# Patient Record
Sex: Male | Born: 1955 | Race: White | Hispanic: No | Marital: Married | Smoking: Never smoker
Health system: Southern US, Community
[De-identification: ages and names within clinical notes are randomized; demographics above are authoritative.]

## PROBLEM LIST (undated history)

## (undated) DIAGNOSIS — E0590 Thyrotoxicosis, unspecified without thyrotoxic crisis or storm: Secondary | ICD-10-CM

## (undated) DIAGNOSIS — I4891 Unspecified atrial fibrillation: Secondary | ICD-10-CM

## (undated) HISTORY — PX: PACEMAKER INSERTION: SHX728

---

## 2013-09-19 ENCOUNTER — Encounter (HOSPITAL_COMMUNITY): Payer: Self-pay | Admitting: Emergency Medicine

## 2013-09-19 ENCOUNTER — Observation Stay (HOSPITAL_COMMUNITY)
Admission: EM | Admit: 2013-09-19 | Discharge: 2013-09-20 | Disposition: A | Discharge status: Home / Self Care | Payer: BC Managed Care – PPO | Attending: Internal Medicine | Admitting: Internal Medicine

## 2013-09-19 ENCOUNTER — Emergency Department (HOSPITAL_COMMUNITY): Payer: BC Managed Care – PPO

## 2013-09-19 DIAGNOSIS — R509 Fever, unspecified: Secondary | ICD-10-CM | POA: Diagnosis present

## 2013-09-19 DIAGNOSIS — I10 Essential (primary) hypertension: Secondary | ICD-10-CM | POA: Insufficient documentation

## 2013-09-19 DIAGNOSIS — E876 Hypokalemia: Secondary | ICD-10-CM | POA: Diagnosis present

## 2013-09-19 DIAGNOSIS — E0591 Thyrotoxicosis, unspecified with thyrotoxic crisis or storm: Principal | ICD-10-CM | POA: Diagnosis present

## 2013-09-19 DIAGNOSIS — Z7901 Long term (current) use of anticoagulants: Secondary | ICD-10-CM | POA: Insufficient documentation

## 2013-09-19 DIAGNOSIS — I4891 Unspecified atrial fibrillation: Secondary | ICD-10-CM | POA: Diagnosis present

## 2013-09-19 DIAGNOSIS — IMO0002 Reserved for concepts with insufficient information to code with codable children: Secondary | ICD-10-CM | POA: Insufficient documentation

## 2013-09-19 DIAGNOSIS — Z950 Presence of cardiac pacemaker: Secondary | ICD-10-CM | POA: Insufficient documentation

## 2013-09-19 HISTORY — DX: Unspecified atrial fibrillation: I48.91

## 2013-09-19 HISTORY — DX: Thyrotoxicosis, unspecified without thyrotoxic crisis or storm: E05.90

## 2013-09-19 LAB — CBC
HEMATOCRIT: 41.3 % (ref 39.0–52.0)
Hemoglobin: 14.1 g/dL (ref 13.0–17.0)
MCH: 30.7 pg (ref 26.0–34.0)
MCHC: 34.1 g/dL (ref 30.0–36.0)
MCV: 89.8 fL (ref 78.0–100.0)
Platelets: 195 10*3/uL (ref 150–400)
RBC: 4.6 MIL/uL (ref 4.22–5.81)
RDW: 12.6 % (ref 11.5–15.5)
WBC: 5.5 10*3/uL (ref 4.0–10.5)

## 2013-09-19 LAB — BASIC METABOLIC PANEL
BUN: 18 mg/dL (ref 6–23)
CHLORIDE: 101 meq/L (ref 96–112)
CO2: 28 mEq/L (ref 19–32)
Calcium: 9.4 mg/dL (ref 8.4–10.5)
Creatinine, Ser: 1.08 mg/dL (ref 0.50–1.35)
GFR calc non Af Amer: 73 mL/min — ABNORMAL LOW (ref >90)
GFR, EST AFRICAN AMERICAN: 85 mL/min — AB (ref >90)
Glucose, Bld: 95 mg/dL (ref 70–99)
POTASSIUM: 3.9 meq/L (ref 3.7–5.3)
SODIUM: 140 meq/L (ref 137–147)

## 2013-09-19 LAB — HEPATIC FUNCTION PANEL
ALT: 15 U/L (ref 0–53)
AST: 15 U/L (ref 0–37)
Albumin: 3.4 g/dL — ABNORMAL LOW (ref 3.5–5.2)
Alkaline Phosphatase: 82 U/L (ref 39–117)
Bilirubin, Direct: <0.2 mg/dL (ref 0.0–0.3)
Total Bilirubin: 0.4 mg/dL (ref 0.3–1.2)
Total Protein: 6.3 g/dL (ref 6.0–8.3)

## 2013-09-19 LAB — I-STAT TROPONIN, ED: TROPONIN I, POC: 0 ng/mL (ref 0.00–0.08)

## 2013-09-19 LAB — URINE MICROSCOPIC-ADD ON

## 2013-09-19 LAB — URINALYSIS, ROUTINE W REFLEX MICROSCOPIC
BILIRUBIN URINE: NEGATIVE
Glucose, UA: NEGATIVE mg/dL
KETONES UR: NEGATIVE mg/dL
Leukocytes, UA: NEGATIVE
NITRITE: NEGATIVE
PROTEIN: NEGATIVE mg/dL
SPECIFIC GRAVITY, URINE: 1.015 (ref 1.005–1.030)
UROBILINOGEN UA: 1 mg/dL (ref 0.0–1.0)
pH: 6.5 (ref 5.0–8.0)

## 2013-09-19 LAB — T4, FREE: FREE T4: 3.81 ng/dL — AB (ref 0.80–1.80)

## 2013-09-19 LAB — TSH

## 2013-09-19 MED ORDER — HYDROMORPHONE HCL PF 1 MG/ML IJ SOLN: 0.5000 mg | INTRAMUSCULAR | Status: DC | PRN

## 2013-09-19 MED ORDER — LISINOPRIL 10 MG PO TABS
10.0000 mg | ORAL_TABLET | Freq: Every day | ORAL | Status: DC
  Administered 2013-09-20: 10 mg via ORAL
  Filled 2013-09-19: qty 1

## 2013-09-19 MED ORDER — OXYCODONE HCL 5 MG PO TABS: 5.0000 mg | ORAL_TABLET | ORAL | Status: DC | PRN

## 2013-09-19 MED ORDER — ONDANSETRON HCL 4 MG/2ML IJ SOLN: 4.0000 mg | Freq: Four times a day (QID) | INTRAMUSCULAR | Status: DC | PRN

## 2013-09-19 MED ORDER — METOPROLOL TARTRATE 1 MG/ML IV SOLN: 2.5000 mg | Freq: Four times a day (QID) | INTRAVENOUS | Status: DC | PRN

## 2013-09-19 MED ORDER — ONDANSETRON HCL 4 MG PO TABS
4.0000 mg | ORAL_TABLET | Freq: Four times a day (QID) | ORAL | Status: DC | PRN
Start: 2013-09-19 — End: 2013-09-20

## 2013-09-19 MED ORDER — ALUM & MAG HYDROXIDE-SIMETH 200-200-20 MG/5ML PO SUSP: 30.0000 mL | Freq: Four times a day (QID) | ORAL | Status: DC | PRN

## 2013-09-19 MED ORDER — ACETAMINOPHEN 650 MG RE SUPP
650.0000 mg | Freq: Four times a day (QID) | RECTAL | Status: DC | PRN
Start: 2013-09-19 — End: 2013-09-20

## 2013-09-19 MED ORDER — SODIUM CHLORIDE 0.9 % IV SOLN
INTRAVENOUS | Status: DC
  Administered 2013-09-20: via INTRAVENOUS

## 2013-09-19 MED ORDER — HYDROCHLOROTHIAZIDE 12.5 MG PO CAPS
12.5000 mg | ORAL_CAPSULE | Freq: Every day | ORAL | Status: DC
  Administered 2013-09-20: 12.5 mg via ORAL
  Filled 2013-09-19: qty 1

## 2013-09-19 MED ORDER — METHIMAZOLE 10 MG PO TABS
10.0000 mg | ORAL_TABLET | Freq: Three times a day (TID) | ORAL | Status: DC
  Administered 2013-09-20: 10 mg via ORAL
  Filled 2013-09-19 (×4): qty 1

## 2013-09-19 MED ORDER — LISINOPRIL-HYDROCHLOROTHIAZIDE 10-12.5 MG PO TABS: 1.0000 | ORAL_TABLET | Freq: Every day | ORAL | Status: DC

## 2013-09-19 MED ORDER — PREDNISONE 10 MG PO TABS
10.0000 mg | ORAL_TABLET | Freq: Two times a day (BID) | ORAL | Status: DC
  Administered 2013-09-20: 10 mg via ORAL
  Filled 2013-09-19 (×3): qty 1

## 2013-09-19 MED ORDER — ACETAMINOPHEN 325 MG PO TABS
650.0000 mg | ORAL_TABLET | Freq: Once | ORAL | Status: AC
  Administered 2013-09-19: 650 mg via ORAL
  Filled 2013-09-19: qty 2

## 2013-09-19 MED ORDER — METOPROLOL SUCCINATE ER 100 MG PO TB24
100.0000 mg | ORAL_TABLET | Freq: Two times a day (BID) | ORAL | Status: DC
  Administered 2013-09-20: 100 mg via ORAL
  Filled 2013-09-19 (×3): qty 1

## 2013-09-19 MED ORDER — ACETAMINOPHEN 325 MG PO TABS
650.0000 mg | ORAL_TABLET | Freq: Four times a day (QID) | ORAL | Status: DC | PRN
Start: 2013-09-19 — End: 2013-09-20

## 2013-09-19 MED ORDER — SODIUM CHLORIDE 0.9 % IV BOLUS (SEPSIS)
1000.0000 mL | INTRAVENOUS | Status: AC
  Administered 2013-09-19: 1000 mL via INTRAVENOUS

## 2013-09-19 MED ORDER — LORAZEPAM 2 MG/ML IJ SOLN: 1.0000 mg | Freq: Four times a day (QID) | INTRAMUSCULAR | Status: DC | PRN

## 2013-09-19 MED ORDER — DABIGATRAN ETEXILATE MESYLATE 150 MG PO CAPS
150.0000 mg | ORAL_CAPSULE | Freq: Two times a day (BID) | ORAL | Status: DC
  Administered 2013-09-20: 150 mg via ORAL
  Filled 2013-09-19 (×3): qty 1

## 2013-09-19 NOTE — ED Notes (Signed)
Pt transported to Xray. 

## 2013-09-19 NOTE — H&P (Signed)
[facility] History and Physical  Shawn Platehomas Varano WUJ:[id number] DOB: [birth year] DOA: 09/19/2013  Referring physician:  PCP: Pcp Not In System  Specialists:   Chief Complaint:   HPI: Shawn Berry is a 58 y.o. male with a recent diagnosis of Hyperthyroidism who presents to the ED due to complaints of feeling worse with palpitations, fever and sweats and a feeling of jitteriness over the past week.  He has been placed on Methimazole, and Metoprolol Rx.   He is visiting from out of town and his Endocrinologist is at [facility], and he was advised to report to the nearest hospital if he felt his symptoms were worse.   In the ED. A repeat TSH level was checked and found to be < 0.005.   He was also found ti be in atrial fibrillation with RVR.   He reports having Atrial fibrillation in the past and had been on Amiodarone Rx, and had a Cardioversion X 1.  He reports that his thyroid disease is due to the Amiodarone Rx.    He also reports that he has a pacemaker due to previously having pauses in his heart beat.      Review of Systems:  Constitutional: No Weight Loss, No Weight Gain, Night Sweats, +Fevers, Chills, Fatigue, or Generalized Weakness HEENT: No Headaches, Difficulty Swallowing,Tooth/Dental Problems,Sore Throat,  No Sneezing, Rhinitis, Ear Ache, Nasal Congestion, or Post Nasal Drip,  Cardio-vascular:  No Chest pain, Orthopnea, PND, Edema in lower extremities, Anasarca, Dizziness, +Palpitations  Resp: No Dyspnea, No DOE, No Productive Cough, No Non-Productive Cough, No Hemoptysis, No Change in Color of Mucus,  No Wheezing.    GI: No Heartburn, Indigestion, Abdominal Pain, Nausea, Vomiting, Diarrhea, Change in Bowel Habits,  Loss of Appetite  GU: No Dysuria, Change in Color of Urine, No Urgency or Frequency.  No flank pain.  Musculoskeletal: No Joint Pain or Swelling.  No Decreased Range of Motion. No Back Pain.  Neurologic: No Syncope, No Seizures, +Muscle Weakness and Stiffness, Paresthesia,  Vision Disturbance or Loss, No Diplopia, No Vertigo, No Difficulty Walking,  Skin: No Rash or Lesions. Psych: No Change in Mood or Affect. No Depression or Anxiety. No Memory loss. No Confusion or Hallucinations   Past Medical History  Diagnosis Date  . Atrial fibrillation   . Hyperthyroidism         Hypertension   Past Surgical History  Procedure Laterality Date  . Pacemaker insertion        Prior to Admission medications   Medication Sig Start Date End Date Taking? Authorizing Provider  dabigatran (PRADAXA) 150 MG CAPS capsule Take 150 mg by mouth 2 (two) times daily.   Yes Historical Provider, MD  lisinopril-hydrochlorothiazide (PRINZIDE,ZESTORETIC) 10-12.5 MG per tablet Take 0.5 tablets by mouth daily.   Yes Historical Provider, MD  methimazole (TAPAZOLE) 10 MG tablet Take 10 mg by mouth 3 (three) times daily.   Yes Historical Provider, MD  metoprolol succinate (TOPROL-XL) 25 MG 24 hr tablet Take 100 mg by mouth 2 (two) times daily. Verify with pharmacy and it is XL 4 tablets twice a day. Doctor prescribe it that way   Yes Historical Provider, MD  predniSONE (DELTASONE) 20 MG tablet Take 20 mg by mouth 2 (two) times daily.   Yes Historical Provider, MD     No Known Allergies   Social History:  reports that he has never smoked. He does not have any smokeless tobacco history on file. He reports that he drinks alcohol. He reports that he does  not use illicit drugs.     Family History:     Alzheimers in Mother, who died of Encephalitis  Father- CAD  Physical Exam:  GEN:  Pleasant Well Nourished and Well Developed 58 y.o. Caucasian male examined and in no acute distress; cooperative with exam Filed Vitals:   09/19/13 2000 09/19/13 2005 09/19/13 2030 09/19/13 2059  BP: 117/74  115/75   Pulse: 107  106   Temp:  101.3 F (38.5 C)  99.9 F (37.7 C)  TempSrc:  Oral  Oral  Resp: 25  21   Height:      Weight:      SpO2: 98%  96%    Blood pressure 115/75, pulse 106,  temperature 99.9 F (37.7 C), temperature source Oral, resp. rate 21, height 6' (1.829 m), weight 94.802 kg (209 lb), SpO2 96.00%. PSYCH: He is alert and oriented x4; does not appear anxious does not appear depressed; affect is normal HEENT: Normocephalic and Atraumatic, Mucous membranes pink; PERRLA; EOM intact; Fundi:  Benign;  No scleral icterus, Nares: Patent, Oropharynx: Clear, Fair Dentition, Neck:  FROM, no cervical lymphadenopathy nor thyromegaly or carotid bruit; no JVD; Breasts:: Not examined CHEST WALL: No tenderness CHEST: Normal respiration, clear to auscultation bilaterally HEART: Tachycardia Irregular  rate and rhythm; no murmurs rubs or gallops BACK: No kyphosis or scoliosis; no CVA tenderness ABDOMEN: Positive Bowel Sounds, soft non-tender; no masses, no organomegaly. Rectal Exam: Not done EXTREMITIES: No cyanosis, clubbing or edema; no ulcerations. Genitalia: not examined PULSES: 2+ and symmetric SKIN: Normal hydration no rash or ulceration CNS:  Alert X Oriented x 4, NO Focal Deficits Vascular: pulses palpable throughout    Labs on Admission:  Basic Metabolic Panel:  Recent Labs Lab 09/19/13 1734  NA 140  K 3.9  CL 101  CO2 28  GLUCOSE 95  BUN 18  CREATININE 1.08  CALCIUM 9.4   Liver Function Tests:  Recent Labs Lab 09/19/13 1902  AST 15  ALT 15  ALKPHOS 82  BILITOT 0.4  PROT 6.3  ALBUMIN 3.4*   No results found for this basename: LIPASE, AMYLASE,  in the last 168 hours No results found for this basename: AMMONIA,  in the last 168 hours CBC:  Recent Labs Lab 09/19/13 1734  WBC 5.5  HGB 14.1  HCT 41.3  MCV 89.8  PLT 195   Cardiac Enzymes: No results found for this basename: CKTOTAL, CKMB, CKMBINDEX, TROPONINI,  in the last 168 hours  BNP (last 3 results) No results found for this basename: PROBNP,  in the last 8760 hours CBG: No results found for this basename: GLUCAP,  in the last 168 hours  Radiological Exams on Admission: Dg  Chest 2 View  09/19/2013   CLINICAL DATA:  Atrial fibrillation  EXAM: CHEST  2 VIEW  COMPARISON:  None.  FINDINGS: Cardiomediastinal silhouette is unremarkable. Dual lead cardiac pacemaker in place with leads in right atrium and right ventricle. No acute infiltrate or pleural effusion. No pulmonary edema. Bony thorax is unremarkable.  IMPRESSION: No active cardiopulmonary disease.   Electronically Signed   By: Natasha Mead M.D.   On: 09/19/2013 20:13      EKG: Independently reviewed. Atrial Fibrillation rate = 119     Assessment/Plan:   57 y.o. male with  Principal Problem:   Thyroid storm Active Problems:   Atrial fibrillation with RVR   Hypokalemia   Fever    1.  Thyroid Storm/Hyperthyroidism-  TSH = < 0.005,  Continue Methimazole Rx,  and Metorprolol Rx, and add IV Ativan PRN, and IV Lopressor PRN as BP tolerates.     2.   Atrial fibrillation with Mild RVR-  Due to #1,  Continue Metoprolol Rx, and IV Lopressor PRN, and continue Pradaxa Rx.    3.   Hypokalemia-  Replete K+ and  Check Magnesium.    4.   Fever-  Tylenol PRN, Send Blood Cultures X 2 if Temp > 100.5.      Code Status:   FULL CODE Family Communication:    Wife at Bedside Disposition Plan:       Observation  Time spent: 36 Minutes  Giovanni Biby Velora Heckler [facility] Pager [phone]  If 7PM-7AM, please contact night-coverage [email or web address] Password [facility] 09/19/2013, 10:31 PM

## 2013-09-19 NOTE — ED Notes (Signed)
Report called to [place], RN 2W.

## 2013-09-19 NOTE — ED Provider Notes (Signed)
CSN: [id number]     Arrival date & time 09/19/13  1706 History   First MD Initiated Contact with Patient 09/19/13 1824     Chief Complaint  Patient presents with  . Atrial Fibrillation     (Consider location/radiation/quality/duration/timing/severity/associated sxs/prior Treatment) Patient is a 58 y.o. male presenting with atrial fibrillation. The history is provided by the patient.  Atrial Fibrillation This is a chronic problem. The current episode started more than 1 week ago. The problem occurs constantly. The problem has not changed since onset.Pertinent negatives include no chest pain, no abdominal pain, no headaches and no shortness of breath. Nothing aggravates the symptoms. Nothing relieves the symptoms. He has tried nothing for the symptoms. The treatment provided no relief.    Past Medical History  Diagnosis Date  . Atrial fibrillation   . Thyroid disease    Past Surgical History  Procedure Laterality Date  . Pacemaker insertion     History reviewed. No pertinent family history. History  Substance Use Topics  . Smoking status: Never Smoker   . Smokeless tobacco: Not on file  . Alcohol Use: Yes    Review of Systems  Constitutional: Positive for fever and fatigue.  HENT: Negative for drooling and rhinorrhea.   Eyes: Negative for pain.  Respiratory: Positive for cough. Negative for shortness of breath.   Cardiovascular: Positive for palpitations. Negative for chest pain and leg swelling.  Gastrointestinal: Negative for nausea, vomiting, abdominal pain and diarrhea.       Loose stool  Genitourinary: Negative for dysuria and hematuria.  Musculoskeletal: Negative for gait problem and neck pain.  Skin: Negative for color change.  Neurological: Negative for numbness and headaches.  Hematological: Negative for adenopathy.  Psychiatric/Behavioral: Negative for behavioral problems.  All other systems reviewed and are negative.     Allergies  Review of patient's  allergies indicates no known allergies.  Home Medications   Prior to Admission medications   Not on File   BP 121/80  Pulse 115  Temp(Src) 100.3 F (37.9 C) (Oral)  Resp 20  Ht 6' (1.829 m)  Wt 209 lb (94.802 kg)  BMI 28.34 kg/m2  SpO2 98% Physical Exam  Nursing note and vitals reviewed. Constitutional: He is oriented to person, place, and time. He appears well-developed and well-nourished.  HENT:  Head: Normocephalic and atraumatic.  Right Ear: External ear normal.  Left Ear: External ear normal.  Nose: Nose normal.  Mouth/Throat: Oropharynx is clear and moist. No oropharyngeal exudate.  Eyes: Conjunctivae and EOM are normal. Pupils are equal, round, and reactive to light.  Neck: Normal range of motion. Neck supple.  Cardiovascular: Regular rhythm, normal heart sounds and intact distal pulses.  Exam reveals no gallop and no friction rub.   No murmur heard. A fib, HR 90-120's  Pulmonary/Chest: Effort normal and breath sounds normal. No respiratory distress. He has no wheezes.  Abdominal: Soft. Bowel sounds are normal. He exhibits no distension. There is no tenderness. There is no rebound and no guarding.  Musculoskeletal: Normal range of motion. He exhibits no edema and no tenderness.  Neurological: He is alert and oriented to person, place, and time.  Skin: Skin is warm and dry.  Psychiatric: He has a normal mood and affect. His behavior is normal.    ED Course  Procedures (including critical care time) Labs Review Labs Reviewed  BASIC METABOLIC PANEL - Abnormal; Notable for the following:    GFR calc non Af Amer 73 (*)    GFR calc  Af Amer 85 (*)    All other components within normal limits  TSH - Abnormal; Notable for the following:    TSH <0.005 (*)    All other components within normal limits  T4, FREE - Abnormal; Notable for the following:    Free T4 3.81 (*)    All other components within normal limits  URINALYSIS, ROUTINE W REFLEX MICROSCOPIC - Abnormal;  Notable for the following:    Hgb urine dipstick MODERATE (*)    All other components within normal limits  HEPATIC FUNCTION PANEL - Abnormal; Notable for the following:    Albumin 3.4 (*)    All other components within normal limits  CBC  URINE MICROSCOPIC-ADD ON  BASIC METABOLIC PANEL  CBC  I-STAT TROPOININ, ED    Imaging Review Dg Chest 2 View  09/19/2013   CLINICAL DATA:  Atrial fibrillation  EXAM: CHEST  2 VIEW  COMPARISON:  None.  FINDINGS: Cardiomediastinal silhouette is unremarkable. Dual lead cardiac pacemaker in place with leads in right atrium and right ventricle. No acute infiltrate or pleural effusion. No pulmonary edema. Bony thorax is unremarkable.  IMPRESSION: No active cardiopulmonary disease.   Electronically Signed   By: Natasha MeadLiviu  Pop M.D.   On: 09/19/2013 20:13     EKG Interpretation   Date/Time:  Friday Sep 19 2013 17:12:46 EDT Ventricular Rate:  119 PR Interval:    QRS Duration: 80 QT Interval:  256 QTC Calculation: 360 R Axis:   69 Text Interpretation:  Atrial fibrillation with rapid ventricular response  Abnormal ECG No previous ECGs available Confirmed by Damoney Julia  MD, Alphonsine Minium  ([id number]) on 09/19/2013 6:32:34 PM      MDM   Final diagnoses:  Atrial fibrillation with RVR  Fever    6:48 PM 58 y.o. male with a history of atrial fibrillation and hyperthyroidism who presents with fatigue, low-grade fever, palpitations. He notes his symptoms have been worsening over the last 3 weeks. He is on metoprolol and methimazole. He has a low-grade temperature of 100.3 here. He appears to be in A. fib with RVR a heart rate ranging from the 90s to the 120s. I'll signs otherwise unremarkable. He denies any chest pain or shortness of breath. Screening labwork performed prior to my evaluation. Will get chest x-ray, IV fluid bolus, and thyroid studies. In review of patient's medication it appears that he might have not taken the right medications this morning.  Discussed case  w/ Dr. Idamae LusherGiordano ([facility]) who recommends not changing the pt's meds. Doubt impending thyroid storm given Burch & Wartofsky diagnostic criteria (score of 20). Will give evening meds and monitor.   HR and temp increased slightly. Would feel more comfortable if pt admitted and monitored. Will admit to hospitalist.   Junius ArgyleForrest S Ashvik Grundman, MD 09/20/13 [phone]

## 2013-09-19 NOTE — ED Notes (Signed)
Present with heart racing, EKG shows Afib with RVR, pt also has slight fever, reports aching joints, jitters and "I just feel worse and worse"  Found out that he had hypothyroid 3 weeks ago and has been having troubles since.

## 2013-09-19 NOTE — ED Notes (Signed)
Pt back from X-ray.  

## 2013-09-20 LAB — CBC
HEMATOCRIT: 37.6 % — AB (ref 39.0–52.0)
Hemoglobin: 12.3 g/dL — ABNORMAL LOW (ref 13.0–17.0)
MCH: 29.6 pg (ref 26.0–34.0)
MCHC: 32.7 g/dL (ref 30.0–36.0)
MCV: 90.4 fL (ref 78.0–100.0)
Platelets: 169 10*3/uL (ref 150–400)
RBC: 4.16 MIL/uL — ABNORMAL LOW (ref 4.22–5.81)
RDW: 12.6 % (ref 11.5–15.5)
WBC: 3.5 10*3/uL — ABNORMAL LOW (ref 4.0–10.5)

## 2013-09-20 LAB — BASIC METABOLIC PANEL
BUN: 20 mg/dL (ref 6–23)
CO2: 28 meq/L (ref 19–32)
CREATININE: 1.06 mg/dL (ref 0.50–1.35)
Calcium: 9.5 mg/dL (ref 8.4–10.5)
Chloride: 106 mEq/L (ref 96–112)
GFR calc non Af Amer: 75 mL/min — ABNORMAL LOW (ref >90)
GFR, EST AFRICAN AMERICAN: 87 mL/min — AB (ref >90)
Glucose, Bld: 121 mg/dL — ABNORMAL HIGH (ref 70–99)
Potassium: 4.5 mEq/L (ref 3.7–5.3)
Sodium: 142 mEq/L (ref 137–147)

## 2013-09-20 MED ORDER — METHIMAZOLE 10 MG PO TABS
10.0000 mg | ORAL_TABLET | Freq: Once | ORAL | Status: AC
  Administered 2013-09-20: 10 mg via ORAL

## 2013-09-20 MED ORDER — PREDNISONE 20 MG PO TABS
30.0000 mg | ORAL_TABLET | Freq: Once | ORAL | Status: DC
  Filled 2013-09-20: qty 1

## 2013-09-20 MED ORDER — METHIMAZOLE 10 MG PO TABS: 20.0000 mg | ORAL_TABLET | Freq: Three times a day (TID) | ORAL | Status: AC

## 2013-09-20 MED ORDER — METHIMAZOLE 10 MG PO TABS
10.0000 mg | ORAL_TABLET | Freq: Once | ORAL | Status: DC
  Filled 2013-09-20: qty 1

## 2013-09-20 MED ORDER — PREDNISONE 10 MG PO TABS
30.0000 mg | ORAL_TABLET | Freq: Once | ORAL | Status: AC
  Administered 2013-09-20: 30 mg via ORAL

## 2013-09-20 MED ORDER — PREDNISONE 20 MG PO TABS: 40.0000 mg | ORAL_TABLET | Freq: Every day | ORAL | Status: AC

## 2013-09-20 NOTE — Progress Notes (Signed)
Dc home belongings, with family, reviewed dc instructions with verbal teachback understanding Georgette Dover

## 2013-09-20 NOTE — Discharge Summary (Signed)
Physician Discharge Summary  [place] HBZ:[id number] DOB: [birth year] DOA: 09/19/2013  PCP: Pcp Not In System  Admit date: 09/19/2013 Discharge date: 09/20/2013  Discharge Diagnoses:  Principal Problem:   Thyroid storm Active Problems:   Atrial fibrillation with RVR   Hypokalemia   Fever benign hypertension.  Discharge Condition: stable  Filed Weights   09/19/13 1715  Weight: 94.802 kg (209 lb)    History of present illness:  58 y.o. male with a recent diagnosis of Hyperthyroidism who presents to the ED due to complaints of feeling worse with palpitations, fever and sweats and a feeling of jitteriness over the past week. He has been placed on Methimazole, and Metoprolol Rx. He is visiting from out of town and his Endocrinologist is at [facility], and he was advised to report to the nearest hospital if he felt his symptoms were worse. In the ED. A repeat TSH level was checked and found to be < 0.005. He was also found ti be in atrial fibrillation with RVR. He reports having Atrial fibrillation in the past and had been on Amiodarone Rx, and had a Cardioversion X 1. He reports that his thyroid disease is due to the Amiodarone Rx. He also reports that he has a pacemaker due to previously having pauses in his heart beat.   Hospital Course:  Admitted to telemetry. Infectious workup negative. Given saline infusion and metoprolol dose increased. Pt had been taking metoprolol 100 mg q am, 50 qhs.  Already on elequis as outpatient. Changed to 100 mg bid. zestoretic held to avoid hypotension. By discharge, heart rate around 90, pt feeling better with good exercise tolerance and requesting discharge. Declines anxiolytic. Has appointment with endocrinologist next week for blood work  Procedures:  none  Consultations:  none  Discharge Exam: Filed Vitals:   09/20/13 0952  BP: 119/78  Pulse: 101  Temp:   Resp: 18    General: alert, orietnted, cooperative Cardiovascular: Irreg  irreg Respiratory: CTA  Discharge Instructions You were cared for by a hospitalist during your hospital stay. If you have any questions about your discharge medications or the care you received while you were in the hospital after you are discharged, you can call the unit and asked to speak with the hospitalist on call if the hospitalist that took care of you is not available. Once you are discharged, your primary care physician will handle any further medical issues. Please note that NO REFILLS for any discharge medications will be authorized once you are discharged, as it is imperative that you return to your primary care physician (or establish a relationship with a primary care physician if you do not have one) for your aftercare needs so that they can reassess your need for medications and monitor your lab values.  Discharge Instructions   Diet - low sodium heart healthy    Complete by:  As directed      Increase activity slowly    Complete by:  As directed             Medication List    STOP taking these medications       lisinopril-hydrochlorothiazide 10-12.5 MG per tablet  Commonly known as:  PRINZIDE,ZESTORETIC      TAKE these medications       dabigatran 150 MG Caps capsule  Commonly known as:  PRADAXA  Take 150 mg by mouth 2 (two) times daily.     methimazole 10 MG tablet  Commonly known as:  TAPAZOLE  Take 2  tablets (20 mg total) by mouth 3 (three) times daily.     metoprolol succinate 25 MG 24 hr tablet  Commonly known as:  TOPROL-XL  Take 100 mg by mouth 2 (two) times daily. Verify with pharmacy and it is XL 4 tablets twice a day. Doctor prescribe it that way     predniSONE 20 MG tablet  Commonly known as:  DELTASONE  Take 2 tablets (40 mg total) by mouth daily with breakfast.       No Known Allergies     Follow-up Information   Follow up with your endocrinologist. (next week)        The results of significant diagnostics from this hospitalization  (including imaging, microbiology, ancillary and laboratory) are listed below for reference.    Significant Diagnostic Studies: Dg Chest 2 View  09/19/2013   CLINICAL DATA:  Atrial fibrillation  EXAM: CHEST  2 VIEW  COMPARISON:  None.  FINDINGS: Cardiomediastinal silhouette is unremarkable. Dual lead cardiac pacemaker in place with leads in right atrium and right ventricle. No acute infiltrate or pleural effusion. No pulmonary edema. Bony thorax is unremarkable.  IMPRESSION: No active cardiopulmonary disease.   Electronically Signed   By: Natasha MeadLiviu  Pop M.D.   On: 09/19/2013 20:13    Microbiology: No results found for this or any previous visit (from the past 240 hour(s)).   Labs: Basic Metabolic Panel:  Recent Labs Lab 09/19/13 1734 09/20/13 0320  NA 140 142  K 3.9 4.5  CL 101 106  CO2 28 28  GLUCOSE 95 121*  BUN 18 20  CREATININE 1.08 1.06  CALCIUM 9.4 9.5   Liver Function Tests:  Recent Labs Lab 09/19/13 1902  AST 15  ALT 15  ALKPHOS 82  BILITOT 0.4  PROT 6.3  ALBUMIN 3.4*   No results found for this basename: LIPASE, AMYLASE,  in the last 168 hours No results found for this basename: AMMONIA,  in the last 168 hours CBC:  Recent Labs Lab 09/19/13 1734 09/20/13 0320  WBC 5.5 3.5*  HGB 14.1 12.3*  HCT 41.3 37.6*  MCV 89.8 90.4  PLT 195 169   Cardiac Enzymes: No results found for this basename: CKTOTAL, CKMB, CKMBINDEX, TROPONINI,  in the last 168 hours BNP: BNP (last 3 results) No results found for this basename: PROBNP,  in the last 8760 hours CBG: No results found for this basename: GLUCAP,  in the last 168 hours     Signed:  Christiane Haorinna L Jeff Frieden  [facility] 09/20/2013, 10:30 AM

## 2015-09-21 IMAGING — CR DG CHEST 2V
2 series · 2 of 2 positions shown · non-contrast
Comparison: None.

CLINICAL DATA: Atrial fibrillation

EXAM:
CHEST  2 VIEW

[w chest pa]
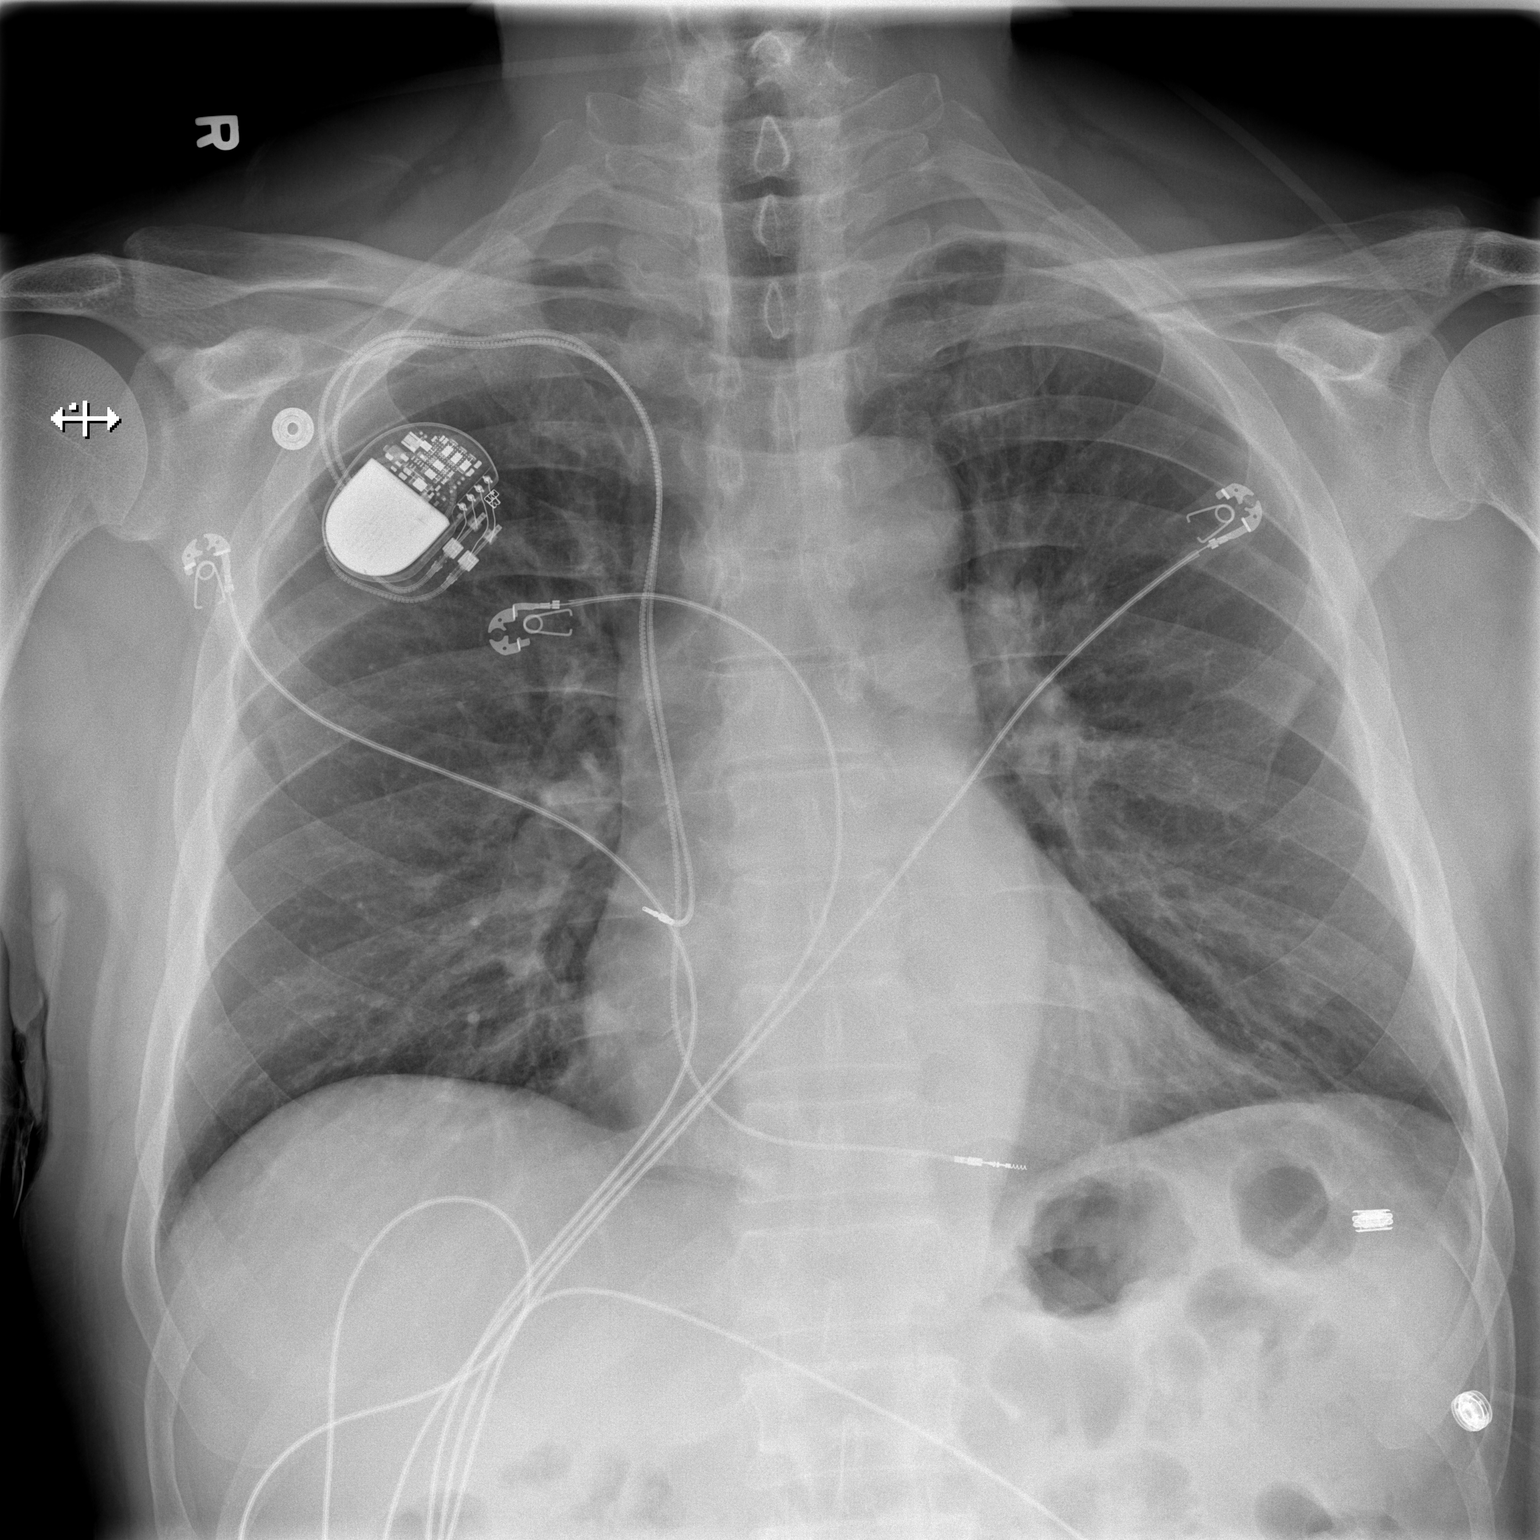

[w chest lat]
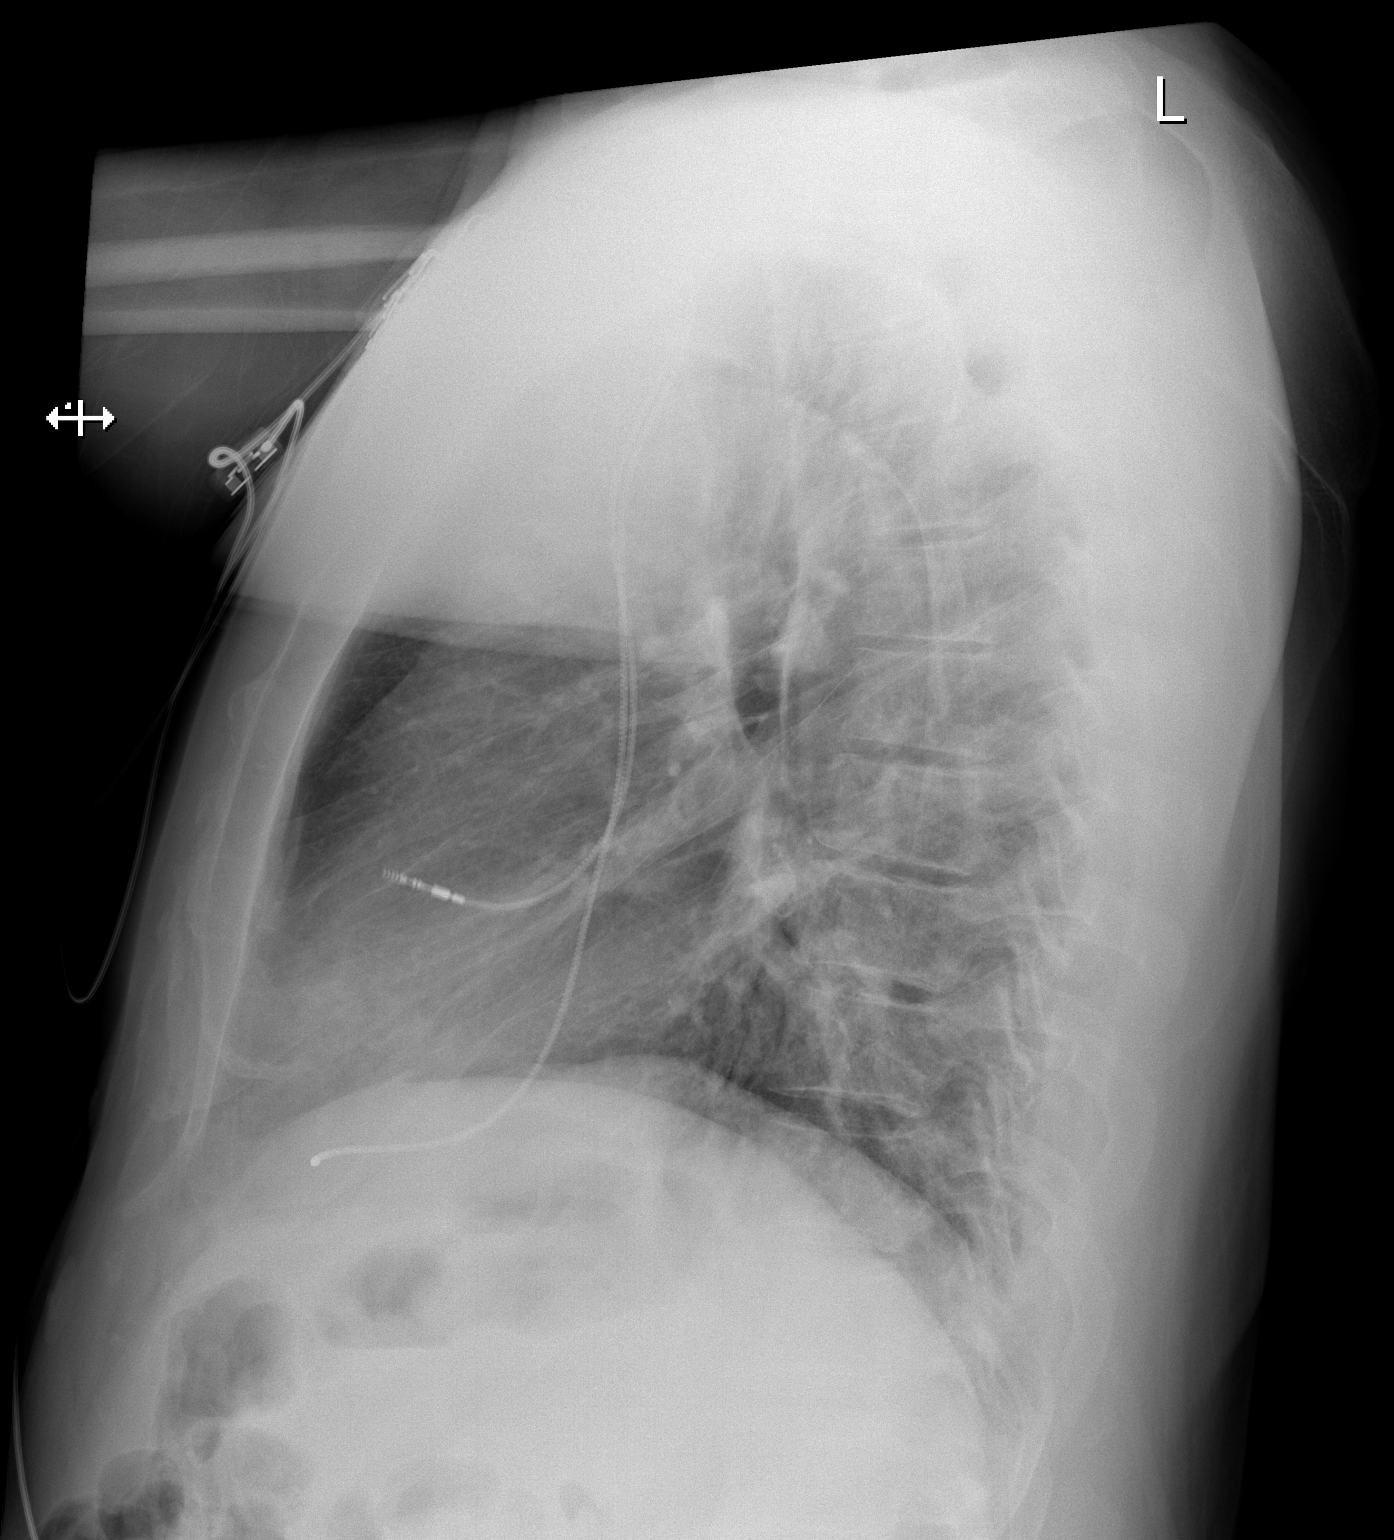

[2 of 2 positions shown; findings below may reference images not displayed]

FINDINGS: Cardiomediastinal silhouette is unremarkable. Dual lead cardiac
pacemaker in place with leads in right atrium and right ventricle.
No acute infiltrate or pleural effusion. No pulmonary edema. Bony
thorax is unremarkable.
IMPRESSION: No active cardiopulmonary disease.
